# Patient Record
Sex: Female | Born: 2000 | Hispanic: Yes | Marital: Single | Smoking: Never smoker
Health system: Southern US, Community
[De-identification: ages and names within clinical notes are randomized; demographics above are authoritative.]

## PROBLEM LIST (undated history)

## (undated) DIAGNOSIS — F32A Depression, unspecified: Secondary | ICD-10-CM

## (undated) DIAGNOSIS — R251 Tremor, unspecified: Secondary | ICD-10-CM

## (undated) DIAGNOSIS — Z973 Presence of spectacles and contact lenses: Secondary | ICD-10-CM

## (undated) DIAGNOSIS — F329 Major depressive disorder, single episode, unspecified: Secondary | ICD-10-CM

---
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
## 2022-07-13 ENCOUNTER — Encounter (HOSPITAL_BASED_OUTPATIENT_CLINIC_OR_DEPARTMENT_OTHER): Payer: Self-pay

## 2022-07-13 ENCOUNTER — Emergency Department (HOSPITAL_BASED_OUTPATIENT_CLINIC_OR_DEPARTMENT_OTHER)
Admission: EM | Admit: 2022-07-13 | Discharge: 2022-07-13 | Disposition: A | Discharge status: Home / Self Care | Payer: Medicaid Other | Attending: Emergency Medicine | Admitting: Emergency Medicine

## 2022-07-13 DIAGNOSIS — R109 Unspecified abdominal pain: Secondary | ICD-10-CM | POA: Diagnosis present

## 2022-07-13 DIAGNOSIS — R112 Nausea with vomiting, unspecified: Secondary | ICD-10-CM | POA: Diagnosis not present

## 2022-07-13 LAB — CBC
HCT: 40.8 % (ref 36.0–46.0)
Hemoglobin: 14.7 g/dL (ref 12.0–15.0)
MCH: 31.5 pg (ref 26.0–34.0)
MCHC: 36 g/dL (ref 30.0–36.0)
MCV: 87.6 fL (ref 80.0–100.0)
Platelets: 420 10*3/uL — ABNORMAL HIGH (ref 150–400)
RBC: 4.66 MIL/uL (ref 3.87–5.11)
RDW: 12.7 % (ref 11.5–15.5)
WBC: 11.8 10*3/uL — ABNORMAL HIGH (ref 4.0–10.5)
nRBC: 0 % (ref 0.0–0.2)

## 2022-07-13 LAB — COMPREHENSIVE METABOLIC PANEL
ALT: 35 U/L (ref 0–44)
AST: 56 U/L — ABNORMAL HIGH (ref 15–41)
Albumin: 3.6 g/dL (ref 3.5–5.0)
Alkaline Phosphatase: 61 U/L (ref 38–126)
Anion gap: 7 (ref 5–15)
BUN: 9 mg/dL (ref 6–20)
CO2: 22 mmol/L (ref 22–32)
Calcium: 8.6 mg/dL — ABNORMAL LOW (ref 8.9–10.3)
Chloride: 105 mmol/L (ref 98–111)
Creatinine, Ser: 0.57 mg/dL (ref 0.44–1.00)
GFR, Estimated: >60 mL/min (ref >60)
Glucose, Bld: 88 mg/dL (ref 70–99)
Potassium: 3.5 mmol/L (ref 3.5–5.1)
Sodium: 134 mmol/L — ABNORMAL LOW (ref 135–145)
Total Bilirubin: 1 mg/dL (ref 0.3–1.2)
Total Protein: 7.5 g/dL (ref 6.5–8.1)

## 2022-07-13 LAB — URINALYSIS, ROUTINE W REFLEX MICROSCOPIC
Bilirubin Urine: NEGATIVE
Glucose, UA: NEGATIVE mg/dL
Ketones, ur: 15 mg/dL — AB
Leukocytes,Ua: NEGATIVE
Nitrite: NEGATIVE
Protein, ur: NEGATIVE mg/dL
Specific Gravity, Urine: >=1.03 (ref 1.005–1.030)
pH: 6 (ref 5.0–8.0)

## 2022-07-13 LAB — URINALYSIS, MICROSCOPIC (REFLEX)

## 2022-07-13 LAB — LIPASE, BLOOD: Lipase: 29 U/L (ref 11–51)

## 2022-07-13 LAB — PREGNANCY, URINE: Preg Test, Ur: NEGATIVE

## 2022-07-13 MED ORDER — DICYCLOMINE HCL 20 MG PO TABS: 20.0000 mg | ORAL_TABLET | Freq: Two times a day (BID) | ORAL | 0 refills | Status: AC

## 2022-07-13 MED ORDER — OMEPRAZOLE 20 MG PO CPDR: 20.0000 mg | DELAYED_RELEASE_CAPSULE | Freq: Every day | ORAL | 0 refills | Status: AC

## 2022-07-13 NOTE — ED Triage Notes (Addendum)
Pt reports intermittent stabbing abd pain, rib cage and generalized back pain associated with nausea and vomiting. Emesis x 3 today. She states the pain only occurs after she eats. She reports she had her gallbladder removed in 2020.

## 2022-07-13 NOTE — ED Provider Notes (Signed)
[facility] Provider Note   CSN: [id number] Arrival date & time: 07/13/22  1906     History  Chief Complaint  Patient presents with   Abdominal Pain    Ashley Hensley is a 22 y.o. female.  Patient with history of cholecystectomy, presents for intermittent abdominal pain and vomiting that has been occurring for at least 4 years.  This led up to her having her gallbladder out, however unfortunately this does not really seem to help her symptoms.  Patient describes waves of upper abdominal pain that becomes severe and are associated with vomiting.  She states in a typical month of occur about 6 times.  Over the past week she has had approximately 4 episodes which recurred today and she has had 3 episodes of vomiting.  After she vomits everything she feels better.  Currently nausea and vomiting as well as abdominal pain are resolved.  She has seen her primary care doctor about this and had a recent x-ray done.  She is also prescribed nausea medication which she has not been taking because it has not really helped much.  She is not on any other PPIs or acid blockers.  Has never seen a gastroenterologist per her report.  Per epic, most recently saw PCP on 05/15/2022.  She had labs and x-ray ordered at this time.       Home Medications Prior to Admission medications   Medication Sig Start Date End Date Taking? Authorizing Provider  acetaminophen (TYLENOL) 325 MG tablet Take 650 mg by mouth every 6 (six) hours as needed (for pain.).    [provider]  amitriptyline (ELAVIL) 10 MG tablet Take 10 mg by mouth daily at 3 pm. 08/04/18   [provider]  FLUoxetine (PROZAC) 20 MG capsule Take 20 mg by mouth daily at 3 pm.    [provider]  ondansetron (ZOFRAN ODT) 4 MG disintegrating tablet Take 1 tablet (4 mg total) by mouth every 8 (eight) hours as needed for nausea or vomiting. 10/18/18   Street, [place], [place]       Allergies    Patient has no known allergies.    Review of Systems   Review of Systems  Physical Exam Updated Vital Signs BP (!) 136/90 (BP Location: Left Arm)   Pulse 99   Temp 98.9 F (37.2 C) (Oral)   Resp 18   Ht 4\' 11"  (1.499 m)   Wt 52.2 kg   LMP 07/13/2022 (Exact Date)   SpO2 99%   BMI 23.23 kg/m  Physical Exam Vitals and nursing note reviewed.  Constitutional:      General: She is not in acute distress.    Appearance: She is well-developed.  HENT:     Head: Normocephalic and atraumatic.     Right Ear: External ear normal.     Left Ear: External ear normal.     Nose: Nose normal.  Eyes:     Conjunctiva/sclera: Conjunctivae normal.  Cardiovascular:     Rate and Rhythm: Normal rate and regular rhythm.     Heart sounds: No murmur heard. Pulmonary:     Effort: No respiratory distress.     Breath sounds: No wheezing, rhonchi or rales.  Abdominal:     Palpations: Abdomen is soft.     Tenderness: There is no abdominal tenderness. There is no guarding or rebound.  Musculoskeletal:     Cervical back: Normal range of motion and neck supple.  Right lower leg: No edema.     Left lower leg: No edema.  Skin:    General: Skin is warm and dry.     Findings: No rash.  Neurological:     General: No focal deficit present.     Mental Status: She is alert. Mental status is at baseline.     Motor: No weakness.  Psychiatric:        Mood and Affect: Mood normal.     ED Results / Procedures / Treatments   Labs (all labs ordered are listed, but only abnormal results are displayed) Labs Reviewed  COMPREHENSIVE METABOLIC PANEL - Abnormal; Notable for the following components:      Result Value   Sodium 134 (*)    Calcium 8.6 (*)    AST 56 (*)    All other components within normal limits  CBC - Abnormal; Notable for the following components:   WBC 11.8 (*)    Platelets 420 (*)    All other components within normal limits  URINALYSIS, ROUTINE W REFLEX MICROSCOPIC  - Abnormal; Notable for the following components:   Hgb urine dipstick TRACE (*)    Ketones, ur 15 (*)    All other components within normal limits  URINALYSIS, MICROSCOPIC (REFLEX) - Abnormal; Notable for the following components:   Bacteria, UA MANY (*)    All other components within normal limits  LIPASE, BLOOD  PREGNANCY, URINE    EKG None  Radiology No results found.  Procedures Procedures    Medications Ordered in ED Medications - No data to display  ED Course/ Medical Decision Making/ A&P    Patient seen and examined. History obtained directly from patient. Work-up including labs, imaging, EKG ordered in triage, if performed, were reviewed.    Labs/EKG: Independently reviewed and interpreted.  This included: CBC with minimally elevated white blood cell count 11.8 with normal hemoglobin otherwise unremarkable; CMP with sodium 134, normal kidney function, AST minimally elevated, but better than prior at 56; lipase normal; UA without compelling signs of infection; pregnancy negative.  Imaging: None ordered  Medications/Fluids: None ordered  Most recent vital signs reviewed and are as follows: BP (!) 136/90 (BP Location: Left Arm)   Pulse 99   Temp 98.9 F (37.2 C) (Oral)   Resp 18   Ht 4\' 11"  (1.499 m)   Wt 52.2 kg   LMP 07/13/2022 (Exact Date)   SpO2 99%   BMI 23.23 kg/m   Initial impression: Patient currently improved with reassuring exam, no tenderness on evaluation.  Will p.o. challenge.  She would likely benefit from trial of PPI, antispasmodic and GI follow-up.  8:37 PM patient tolerating fluids  Plan: Discharge to home.   Prescriptions written for: Bentyl, omeprazole  Other home care instructions discussed: [place] diet, Bentyl as needed, daily omeprazole trial  ED return instructions discussed: The patient was urged to return to the Emergency Department immediately with worsening of current symptoms, worsening abdominal pain, persistent vomiting,  blood noted in stools, fever, or any other concerns. The patient verbalized understanding.   Follow-up instructions discussed: Patient encouraged to follow-up with their PCP in 7 days.  I also provided referral for gastroenterology and encouraged follow-up when able.                             Medical Decision Making Amount and/or Complexity of Data Reviewed Labs: ordered.   For this patient's complaint of abdominal pain,  the following conditions were considered on the differential diagnosis: gastritis/PUD, enteritis/duodenitis, appendicitis, cholelithiasis/cholecystitis, cholangitis, pancreatitis, ruptured viscus, colitis, diverticulitis, small/large bowel obstruction, proctitis, cystitis, pyelonephritis, ureteral colic, aortic dissection, aortic aneurysm. In women, ectopic pregnancy, pelvic inflammatory disease, ovarian cysts, and tubo-ovarian abscess were also considered. Atypical chest etiologies were also considered including ACS, PE, and pneumonia.  The patient's vital signs, pertinent lab work and imaging were reviewed and interpreted as discussed in the ED course. Hospitalization was considered for further testing, treatments, or serial exams/observation. However as patient is well-appearing, has a stable exam, and reassuring studies today, I do not feel that they warrant admission at this time. This plan was discussed with the patient who verbalizes agreement and comfort with this plan and seems reliable and able to return to the Emergency Department with worsening or changing symptoms.          Final Clinical Impression(s) / ED Diagnoses Final diagnoses:  Abdominal pain, unspecified abdominal location  Nausea and vomiting, unspecified vomiting type    Rx / DC Orders ED Discharge Orders          Ordered    omeprazole (PRILOSEC) 20 MG capsule  Daily        07/13/22 2035    dicyclomine (BENTYL) 20 MG tablet  2 times daily        07/13/22 2035              Carlisle Cater, Hershal Coria 07/13/22 2038    Fredia Sorrow, MD 07/15/22 [phone]

## 2022-07-13 NOTE — Discharge Instructions (Signed)
Please read and follow all provided instructions.  Your diagnoses today include:  1. Abdominal pain, unspecified abdominal location   2. Nausea and vomiting, unspecified vomiting type     Tests performed today include: Blood cell counts and platelets Kidney and liver function tests Pancreas function test (called lipase) Urine test to look for infection A blood or urine test for pregnancy (women only) Vital signs. See below for your results today.   Medications prescribed:  Omeprazole (Prilosec) - stomach acid reducer  This medication can be found over-the-counter  Bentyl - medication for intestinal cramps and spasms  Take any prescribed medications only as directed.  Home care instructions:  Follow any educational materials contained in this packet.  Follow-up instructions: Please follow-up with your primary care provider in the next 7 days for further evaluation of your symptoms.    Return instructions:  SEEK IMMEDIATE MEDICAL ATTENTION IF: The pain does not go away or becomes severe  A temperature above 101F develops  Repeated vomiting occurs (multiple episodes)  The pain becomes localized to portions of the abdomen. The right side could possibly be appendicitis. In an adult, the left lower portion of the abdomen could be colitis or diverticulitis.  Blood is being passed in stools or vomit (bright red or black tarry stools)  You develop chest pain, difficulty breathing, dizziness or fainting, or become confused, poorly responsive, or inconsolable (young children) If you have any other emergent concerns regarding your health  Additional Information: Abdominal (belly) pain can be caused by many things. Your caregiver performed an examination and possibly ordered blood/urine tests and imaging (CT scan, x-rays, ultrasound). Many cases can be observed and treated at home after initial evaluation in the emergency department. Even though you are being discharged home, abdominal  pain can be unpredictable. Therefore, you need a repeated exam if your pain does not resolve, returns, or worsens. Most patients with abdominal pain don't have to be admitted to the hospital or have surgery, but serious problems like appendicitis and gallbladder attacks can start out as nonspecific pain. Many abdominal conditions cannot be diagnosed in one visit, so follow-up evaluations are very important.  Your vital signs today were: BP (!) 136/90 (BP Location: Left Arm)   Pulse 99   Temp 98.9 F (37.2 C) (Oral)   Resp 18   Ht 4\' 11"  (1.499 m)   Wt 52.2 kg   LMP 07/13/2022 (Exact Date)   SpO2 99%   BMI 23.23 kg/m  If your blood pressure (bp) was elevated above 135/85 this visit, please have this repeated by your doctor within one month. --------------

## 2022-07-28 ENCOUNTER — Emergency Department (HOSPITAL_COMMUNITY)
Admission: EM | Admit: 2022-07-28 | Discharge: 2022-07-28 | Disposition: A | Discharge status: Home / Self Care | Payer: Medicaid Other | Attending: Emergency Medicine | Admitting: Emergency Medicine

## 2022-07-28 ENCOUNTER — Emergency Department (HOSPITAL_COMMUNITY): Payer: Medicaid Other

## 2022-07-28 ENCOUNTER — Encounter (HOSPITAL_COMMUNITY): Payer: Self-pay | Admitting: Emergency Medicine

## 2022-07-28 DIAGNOSIS — D72829 Elevated white blood cell count, unspecified: Secondary | ICD-10-CM | POA: Diagnosis not present

## 2022-07-28 DIAGNOSIS — R1031 Right lower quadrant pain: Secondary | ICD-10-CM

## 2022-07-28 LAB — COMPREHENSIVE METABOLIC PANEL
ALT: 450 U/L — ABNORMAL HIGH (ref 0–44)
AST: 482 U/L — ABNORMAL HIGH (ref 15–41)
Albumin: 3.8 g/dL (ref 3.5–5.0)
Alkaline Phosphatase: 108 U/L (ref 38–126)
Anion gap: 8 (ref 5–15)
BUN: <5 mg/dL — ABNORMAL LOW (ref 6–20)
CO2: 25 mmol/L (ref 22–32)
Calcium: 9.3 mg/dL (ref 8.9–10.3)
Chloride: 102 mmol/L (ref 98–111)
Creatinine, Ser: 0.59 mg/dL (ref 0.44–1.00)
GFR, Estimated: >60 mL/min (ref >60)
Glucose, Bld: 93 mg/dL (ref 70–99)
Potassium: 3.6 mmol/L (ref 3.5–5.1)
Sodium: 135 mmol/L (ref 135–145)
Total Bilirubin: 1 mg/dL (ref 0.3–1.2)
Total Protein: 7.3 g/dL (ref 6.5–8.1)

## 2022-07-28 LAB — I-STAT BETA HCG BLOOD, ED (MC, WL, AP ONLY): I-stat hCG, quantitative: 6.2 m[IU]/mL — ABNORMAL HIGH (ref <5)

## 2022-07-28 LAB — CBC WITH DIFFERENTIAL/PLATELET
Abs Immature Granulocytes: 0.03 10*3/uL (ref 0.00–0.07)
Basophils Absolute: 0 10*3/uL (ref 0.0–0.1)
Basophils Relative: 1 %
Eosinophils Absolute: 0.4 10*3/uL (ref 0.0–0.5)
Eosinophils Relative: 4 %
HCT: 45.4 % (ref 36.0–46.0)
Hemoglobin: 15.6 g/dL — ABNORMAL HIGH (ref 12.0–15.0)
Immature Granulocytes: 0 %
Lymphocytes Relative: 32 %
Lymphs Abs: 2.7 10*3/uL (ref 0.7–4.0)
MCH: 31.4 pg (ref 26.0–34.0)
MCHC: 34.4 g/dL (ref 30.0–36.0)
MCV: 91.3 fL (ref 80.0–100.0)
Monocytes Absolute: 1 10*3/uL (ref 0.1–1.0)
Monocytes Relative: 12 %
Neutro Abs: 4.2 10*3/uL (ref 1.7–7.7)
Neutrophils Relative %: 51 %
Platelets: 437 10*3/uL — ABNORMAL HIGH (ref 150–400)
RBC: 4.97 MIL/uL (ref 3.87–5.11)
RDW: 12.5 % (ref 11.5–15.5)
WBC: 8.3 10*3/uL (ref 4.0–10.5)
nRBC: 0 % (ref 0.0–0.2)

## 2022-07-28 LAB — LIPASE, BLOOD: Lipase: 32 U/L (ref 11–51)

## 2022-07-28 LAB — URINALYSIS, ROUTINE W REFLEX MICROSCOPIC
Glucose, UA: NEGATIVE mg/dL
Hgb urine dipstick: NEGATIVE
Ketones, ur: NEGATIVE mg/dL
Nitrite: NEGATIVE
Protein, ur: NEGATIVE mg/dL
Specific Gravity, Urine: 1.025 (ref 1.005–1.030)
pH: 6 (ref 5.0–8.0)

## 2022-07-28 LAB — URINALYSIS, MICROSCOPIC (REFLEX)

## 2022-07-28 LAB — HCG, QUANTITATIVE, PREGNANCY: hCG, Beta Chain, Quant, S: 1 m[IU]/mL (ref <5)

## 2022-07-28 LAB — ACETAMINOPHEN LEVEL: Acetaminophen (Tylenol), Serum: <10 ug/mL — ABNORMAL LOW (ref 10–30)

## 2022-07-28 MED ORDER — ONDANSETRON HCL 4 MG/2ML IJ SOLN
4.0000 mg | Freq: Once | INTRAMUSCULAR | Status: AC
  Administered 2022-07-28: 4 mg via INTRAVENOUS
  Filled 2022-07-28: qty 2

## 2022-07-28 MED ORDER — DICYCLOMINE HCL 10 MG/ML IM SOLN
20.0000 mg | Freq: Once | INTRAMUSCULAR | Status: AC
  Administered 2022-07-28: 20 mg via INTRAMUSCULAR
  Filled 2022-07-28: qty 2

## 2022-07-28 MED ORDER — ONDANSETRON HCL 4 MG PO TABS: 4.0000 mg | ORAL_TABLET | Freq: Three times a day (TID) | ORAL | 0 refills | Status: AC | PRN

## 2022-07-28 MED ORDER — DICYCLOMINE HCL 20 MG PO TABS: 20.0000 mg | ORAL_TABLET | Freq: Two times a day (BID) | ORAL | 0 refills | Status: AC

## 2022-07-28 MED ORDER — IOHEXOL 350 MG/ML SOLN
75.0000 mL | Freq: Once | INTRAVENOUS | Status: AC | PRN
  Administered 2022-07-28: 75 mL via INTRAVENOUS

## 2022-07-28 MED ORDER — SODIUM CHLORIDE 0.9 % IV BOLUS
1000.0000 mL | Freq: Once | INTRAVENOUS | Status: AC
  Administered 2022-07-28: 1000 mL via INTRAVENOUS

## 2022-07-28 NOTE — ED Provider Notes (Signed)
[facility] Provider Note   CSN: [id number] Arrival date & time: 07/28/22  1506     History  No chief complaint on file.   Ashley Hensley is a 22 y.o. female.  Patient presents the emergency department complaining of epigastric and right lower quadrant pain.  Patient has extensive history of abdominal pain with previous elective cholecystectomy due to frequent bouts of abdominal pain after eating greasy and spicy food.  Patient states that yesterday she was eating her usual diet and began to have epigastric pain.  She states she laid down on the floor in fetal position after vomiting 3 times to hopefully sleep and avoid any further episodes of emesis.  She states she was able to sleep through the night but this morning when she woke up she still had pain and it radiated to her right lower quadrant.  She states that normally the pain dissipates faster than this.  She still complains of nausea but states that is less severe than yesterday and she has had no episodes of emesis today.  The patient has an appointment with gastroenterology on March 1.  This will be her first appointment with GI.  She was most recently seen on February 3 at one of our [facility] locations and was prescribed Bentyl and Zofran.  She states that the Bentyl did help but that she is currently out of the medication.  Past medical history is otherwise significant for depression, occasional tremors, cholelithiasis, pediatric laparoscopic cholecystectomy in June 2020  HPI     Home Medications Prior to Admission medications   Medication Sig Start Date End Date Taking? Authorizing Provider  dicyclomine (BENTYL) 20 MG tablet Take 1 tablet (20 mg total) by mouth 2 (two) times daily for 15 days. 07/28/22 08/12/22 Yes Dorothyann Peng, PA-C  ondansetron (ZOFRAN) 4 MG tablet Take 1 tablet (4 mg total) by mouth every 8 (eight) hours as needed for nausea or vomiting. 07/28/22  Yes Dorothyann Peng, PA-C  acetaminophen (TYLENOL) 325 MG tablet Take 650 mg by mouth every 6 (six) hours as needed (for pain.).    [provider]  amitriptyline (ELAVIL) 10 MG tablet Take 10 mg by mouth daily at 3 pm. 08/04/18   [provider]  dicyclomine (BENTYL) 20 MG tablet Take 1 tablet (20 mg total) by mouth 2 (two) times daily. 07/13/22   Carlisle Cater, PA-C  FLUoxetine (PROZAC) 20 MG capsule Take 20 mg by mouth daily at 3 pm.    [provider]  omeprazole (PRILOSEC) 20 MG capsule Take 1 capsule (20 mg total) by mouth daily. 07/13/22   Carlisle Cater, PA-C  ondansetron (ZOFRAN ODT) 4 MG disintegrating tablet Take 1 tablet (4 mg total) by mouth every 8 (eight) hours as needed for nausea or vomiting. 10/18/18   [address], [place], [place]      Allergies    Patient has no known allergies.    Review of Systems   Review of Systems  Respiratory:  Negative for shortness of breath.   Gastrointestinal:  Positive for abdominal pain, nausea and vomiting.  Genitourinary:  Negative for dysuria, vaginal bleeding and vaginal discharge.    Physical Exam Updated Vital Signs BP 109/81   Pulse 81   Temp 98.2 F (36.8 C) (Oral)   Resp 13   LMP 07/13/2022 (Exact Date)   SpO2 98%  Physical Exam Vitals and nursing note reviewed.  Constitutional:      General: She is  not in acute distress.    Appearance: She is well-developed.  HENT:     Head: Normocephalic and atraumatic.  Eyes:     Conjunctiva/sclera: Conjunctivae normal.  Cardiovascular:     Rate and Rhythm: Normal rate and regular rhythm.     Heart sounds: No murmur heard. Pulmonary:     Effort: Pulmonary effort is normal. No respiratory distress.     Breath sounds: Normal breath sounds.  Abdominal:     Palpations: Abdomen is soft.     Tenderness: There is abdominal tenderness (Epigastric, right lower quadrant).  Musculoskeletal:        General: No swelling.     Cervical back: Neck supple.  Skin:    General: Skin is warm  and dry.     Capillary Refill: Capillary refill takes less than 2 seconds.  Neurological:     Mental Status: She is alert.  Psychiatric:        Mood and Affect: Mood normal.     ED Results / Procedures / Treatments   Labs (all labs ordered are listed, but only abnormal results are displayed) Labs Reviewed  COMPREHENSIVE METABOLIC PANEL - Abnormal; Notable for the following components:      Result Value   BUN <5 (*)    AST 482 (*)    ALT 450 (*)    All other components within normal limits  CBC WITH DIFFERENTIAL/PLATELET - Abnormal; Notable for the following components:   Hemoglobin 15.6 (*)    Platelets 437 (*)    All other components within normal limits  URINALYSIS, ROUTINE W REFLEX MICROSCOPIC - Abnormal; Notable for the following components:   Bilirubin Urine SMALL (*)    Leukocytes,Ua SMALL (*)    All other components within normal limits  URINALYSIS, MICROSCOPIC (REFLEX) - Abnormal; Notable for the following components:   Bacteria, UA RARE (*)    All other components within normal limits  ACETAMINOPHEN LEVEL - Abnormal; Notable for the following components:   Acetaminophen (Tylenol), Serum <10 (*)    All other components within normal limits  I-STAT BETA HCG BLOOD, ED ([facility], [facility], [facility] ONLY) - Abnormal; Notable for the following components:   I-stat hCG, quantitative 6.2 (*)    All other components within normal limits  LIPASE, BLOOD  HCG, QUANTITATIVE, PREGNANCY  HEPATITIS PANEL, ACUTE    EKG None  Radiology CT Abdomen Pelvis W Contrast  Result Date: 07/28/2022 CLINICAL DATA:  Abdominal pain, acute, nonlocalized. EXAM: CT ABDOMEN AND PELVIS WITH CONTRAST TECHNIQUE: Multidetector CT imaging of the abdomen and pelvis was performed using the standard protocol following bolus administration of intravenous contrast. RADIATION DOSE REDUCTION: This exam was performed according to the departmental dose-optimization program which includes automated exposure control, adjustment  of the mA and/or kV according to patient size and/or use of iterative reconstruction technique. CONTRAST:  56m OMNIPAQUE IOHEXOL 350 MG/ML SOLN COMPARISON:  None Available. FINDINGS: Lower chest: No acute abnormality. Hepatobiliary: No focal liver abnormality is seen. Status post cholecystectomy. No biliary dilatation. Pancreas: Unremarkable. No pancreatic ductal dilatation or surrounding inflammatory changes. Spleen: Normal in size without focal abnormality. Adrenals/Urinary Tract: No adrenal nodule or mass. The kidneys enhance symmetrically. No renal calculus or hydronephrosis. The bladder is unremarkable. Stomach/Bowel: Stomach is within normal limits. Appendix appears normal. No evidence of bowel wall thickening, distention, or inflammatory changes. No free air or pneumatosis. Vascular/Lymphatic: No significant vascular findings are present. No enlarged abdominal or pelvic lymph nodes. Reproductive: Uterus and bilateral adnexa are unremarkable. Other: No  abdominopelvic ascites. Musculoskeletal: No acute osseous abnormality. IMPRESSION: No acute intra-abdominal process. Electronically Signed   By: Brett Fairy M.D.   On: 07/28/2022 20:52    Procedures Procedures    Medications Ordered in ED Medications  dicyclomine (BENTYL) injection 20 mg (20 mg Intramuscular Given 07/28/22 1625)  ondansetron (ZOFRAN) injection 4 mg (4 mg Intravenous Given 07/28/22 1624)  sodium chloride 0.9 % bolus 1,000 mL (0 mLs Intravenous Stopped 07/28/22 1828)  iohexol (OMNIPAQUE) 350 MG/ML injection 75 mL (75 mLs Intravenous Contrast Given 07/28/22 2030)    ED Course/ Medical Decision Making/ A&P                             Medical Decision Making Amount and/or Complexity of Data Reviewed Labs: ordered. Radiology: ordered.  Risk Prescription drug management.   This patient presents to the ED for concern of abdominal pain, this involves an extensive number of treatment options, and is a complaint that carries with  it a high risk of complications and morbidity.  The differential diagnosis includes appendicitis gastroenteritis, gastritis, colitis, pancreatitis, and others   Co morbidities that complicate the patient evaluation  History of symptomatic cholelithiasis with previous cholecystectomy   Additional history obtained:  Additional history obtained from family bedside External records from outside source obtained and reviewed including emergency department notes from February 3   Lab Tests:  I Ordered, and personally interpreted labs.  The pertinent results include: Unremarkable CBC, UA with rare bacteria, small bilirubin, small leukocytes; lipase 32, acetaminophen level less than 10, hepatitis panel pending; initial i-STAT beta-hCG with a result of 6.2, repeat quantitative hCG with result of 1 showing no pregnancy   Imaging Studies ordered:  I ordered imaging studies including CT abdomen pelvis with contrast I independently visualized and interpreted imaging which showed no acute findings I agree with the radiologist interpretation   Problem List / ED Course / Critical interventions / Medication management   I ordered medication including Bentyl for abdominal cramping/pain, normal saline for fluid resuscitation, Zofran for nausea Reevaluation of the patient after these medicines showed that the patient improved I have reviewed the patients home medicines and have made adjustments as needed   Social Determinants of Health:  Patient with Medicaid for primary coverage   Test / Admission - Considered:  There is medication at this time for admission.  The patient has outpatient follow-up with GI scheduled for March 1.  She was doing better with the Bentyl.  I have prescribed a new prescription for Bentyl to get her through until her GI appointment.  CT scan was performed to evaluate for appendicitis or other acute causes of the patient's pain which needed immediate intervention.  No  acute cause was found.  No signs of pancreatitis or appendicitis.  Patient previous had cholecystectomy.  Plan to discharge home at this time with return precautions and prescription for Zofran and Bentyl.  Patient will keep appointment with GI        Final Clinical Impression(s) / ED Diagnoses Final diagnoses:  Right lower quadrant abdominal pain    Rx / DC Orders ED Discharge Orders          Ordered    dicyclomine (BENTYL) 20 MG tablet  2 times daily        07/28/22 2100    ondansetron (ZOFRAN) 4 MG tablet  Every 8 hours PRN        07/28/22 2100  Ronny Bacon 07/28/22 2111    Cristie Hem, MD 07/28/22 [phone]

## 2022-07-28 NOTE — ED Triage Notes (Signed)
Pt here from home with c/o abd pain and some n/v after eating greasy and spicy food, pt does not have a gall bladder , does have a follow up with GI next month

## 2022-07-28 NOTE — Discharge Instructions (Addendum)
You were evaluated today for abdominal pain.  No acute cause was found on today's appointment.  There were no signs of appendicitis or pancreatitis.  I have prescribed refills of the Bentyl and Zofran you are prescribed previously.  Please take these as directed to hopefully help with your symptoms.  Please be sure to keep your upcoming GI appointment for further evaluation and management.  You develop any life-threatening symptoms please return to the emergency department.
